# Patient Record
Sex: Female | Born: 1975 | Race: Asian | Hispanic: No | Marital: Single | Smoking: Never smoker
Health system: Southern US, Community
[De-identification: ages and names within clinical notes are randomized; demographics above are authoritative.]

## PROBLEM LIST (undated history)

## (undated) DIAGNOSIS — K589 Irritable bowel syndrome without diarrhea: Secondary | ICD-10-CM

## (undated) DIAGNOSIS — F419 Anxiety disorder, unspecified: Secondary | ICD-10-CM

---

## 2001-01-01 ENCOUNTER — Other Ambulatory Visit: Admission: RE | Admit: 2001-01-01 | Discharge: 2001-01-01 | Payer: Self-pay | Admitting: Obstetrics and Gynecology

## 2002-01-01 ENCOUNTER — Ambulatory Visit (HOSPITAL_COMMUNITY): Admission: RE | Admit: 2002-01-01 | Discharge: 2002-01-01 | Payer: Self-pay | Admitting: Obstetrics and Gynecology

## 2002-01-01 ENCOUNTER — Encounter: Payer: Self-pay | Admitting: Obstetrics and Gynecology

## 2002-05-27 ENCOUNTER — Encounter (INDEPENDENT_AMBULATORY_CARE_PROVIDER_SITE_OTHER): Payer: Self-pay

## 2002-05-27 ENCOUNTER — Inpatient Hospital Stay (HOSPITAL_COMMUNITY): Admission: AD | Admit: 2002-05-27 | Discharge: 2002-05-30 | Payer: Self-pay | Admitting: Obstetrics and Gynecology

## 2002-05-31 ENCOUNTER — Encounter: Admission: RE | Admit: 2002-05-31 | Discharge: 2002-06-30 | Payer: Self-pay | Admitting: Obstetrics and Gynecology

## 2003-01-04 ENCOUNTER — Other Ambulatory Visit: Admission: RE | Admit: 2003-01-04 | Discharge: 2003-01-04 | Payer: Self-pay | Admitting: *Deleted

## 2009-07-21 ENCOUNTER — Ambulatory Visit: Payer: Self-pay | Admitting: Internal Medicine

## 2009-07-21 DIAGNOSIS — N390 Urinary tract infection, site not specified: Secondary | ICD-10-CM | POA: Insufficient documentation

## 2009-07-21 LAB — CONVERTED CEMR LAB
Bilirubin Urine: NEGATIVE
Blood in Urine, dipstick: NEGATIVE
Glucose, Urine, Semiquant: NEGATIVE
Ketones, urine, test strip: NEGATIVE
Nitrite: NEGATIVE
Pap Smear: NORMAL
Protein, U semiquant: NEGATIVE
Specific Gravity, Urine: <1.005
Urobilinogen, UA: 0.2
WBC Urine, dipstick: NEGATIVE
pH: 5

## 2009-07-22 ENCOUNTER — Encounter: Payer: Self-pay | Admitting: Internal Medicine

## 2011-04-20 NOTE — Discharge Summary (Signed)
[facility]  Patient:    Chelsea Valentine, Chelsea Valentine Visit Number: [id number] MRN: [id number]          Service Type: OBS Location: [id number] Attending Physician:  Oliver Pila Dictated by:   Zenaida Niece, M.D. Admit Date:  05/27/2002 Discharge Date: 05/30/2002                             Discharge Summary  ADMISSION DIAGNOSES:          Intrauterine pregnancy at 39 weeks.  DISCHARGE DIAGNOSIS:          Intrauterine pregnancy at 39 weeks.  PROCEDURE:                    Spontaneous vaginal delivery and removal of skin tag from right buttock.  HISTORY OF PRESENT ILLNESS:   This is a 35 year old Asian female, gravida 1, para 0, with an EGA of 39+ weeks with a due date of July 1 by an LMP consistent with a first trimester ultrasound, who presents with the complaint of rupture of membranes at 2200 on June 25 without contractions or bleeding and with good fetal movement.  Rupture of membranes was confirmed in maternity admissions unit and she was admitted at approximately 0130 on June 26, and begun on Pitocin.  Prenatal care has been uncomplicated.  PRENATAL LABORATORY DATA:     Blood type is B positive with a negative antibody screen, RPR nonreactive, rubella immune, hepatitis B surface antigen negative, HIV negative, Gonorrhea and Chlamydia negative, triple screen normal, Group B Strep negative.  One-hour GCT is normal.  PAST MEDICAL HISTORY:         Noncontributory.  PHYSICAL EXAMINATION:         VITAL SIGNS: She is afebrile with stable vital signs.  Fetal heart rate tracing reactive.  ABDOMEN: Gravid and nontender with an estimated fetal weight of 7-1/2 to 8 pounds.  PELVIC: Vaginal examination per the nurses at 0700 on June 26 is 2+, 70, -1.  HOSPITAL COURSE:              The patient was admitted with rupture of membranes and started on Pitocin.  She received IV Stadol and then received an epidural for pain.  She had an IUPC placed  to better monitor her contractions and adjust Pitocin.  On the afternoon of June 26, she reached complete, pushed well, and had a vaginal delivery of a vigorous female infant over a first degree laceration.  Apgars were 9 and 9, weight was 7 pounds 12 ounces.  There was a nuchal cord x1 which was reduced.  The placenta delivered spontaneously with a three vessel cord.  First degree laceration repaired with 2-0 Vicryl. She had a small skin tag removed from her right buttock per patient request. Estimated blood loss was 450 cc.  Postpartum, she did very well and had a low grade temperature to 100.5 immediately after delivery, but other than that remained afebrile and breastfed her baby without complications.  Predelivery hemoglobin 11.2, postdelivery 8.9.  On the morning of postpartum day #2, she was stable for discharge home.  DISCHARGE INSTRUCTIONS:       Diet is a regular diet.  Activity is pelvic rest. Follow up is in six weeks.  DISCHARGE MEDICATIONS:        Percocet one to two p.o. q.4-6h. p.r.n. #20, and over-the-counter Motrin p.r.n.  She is given our discharge  pamphlet. Dictated by:   Zenaida Niece, M.D. Attending Physician:  Oliver Pila DD:  05/30/02 TD:  06/01/02 Job: [id number]

## 2017-06-20 DIAGNOSIS — K5909 Other constipation: Secondary | ICD-10-CM | POA: Insufficient documentation

## 2017-06-20 DIAGNOSIS — R1033 Periumbilical pain: Secondary | ICD-10-CM | POA: Insufficient documentation

## 2017-06-20 DIAGNOSIS — R140 Abdominal distension (gaseous): Secondary | ICD-10-CM | POA: Insufficient documentation

## 2017-07-04 ENCOUNTER — Other Ambulatory Visit: Payer: Self-pay | Admitting: Gastroenterology

## 2017-07-04 DIAGNOSIS — R1011 Right upper quadrant pain: Secondary | ICD-10-CM

## 2017-07-04 NOTE — Progress Notes (Signed)
Chelsea Munter MD 

## 2017-07-11 ENCOUNTER — Encounter (HOSPITAL_COMMUNITY)
Admission: RE | Admit: 2017-07-11 | Discharge: 2017-07-11 | Disposition: A | Discharge status: Home / Self Care | Payer: BLUE CROSS/BLUE SHIELD | Source: Ambulatory Visit | Attending: Gastroenterology | Admitting: Gastroenterology

## 2017-07-11 ENCOUNTER — Encounter (HOSPITAL_COMMUNITY): Payer: Self-pay

## 2017-07-11 DIAGNOSIS — R1011 Right upper quadrant pain: Secondary | ICD-10-CM | POA: Insufficient documentation

## 2017-07-11 IMAGING — US US ABDOMEN COMPLETE
1 series · 13 of 25 positions shown · non-contrast
Comparison: None.

CLINICAL DATA: Right upper quadrant abdominal pain.

EXAM:
ABDOMEN ULTRASOUND COMPLETE

[Series 1: us abdomen complete · 0.19mm/px · 13 of 120 slices shown]
[im 1/120]
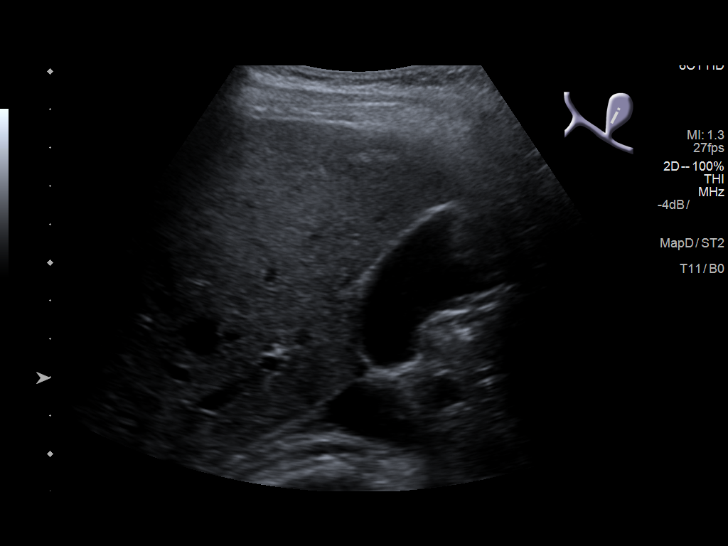
[im 10/120]
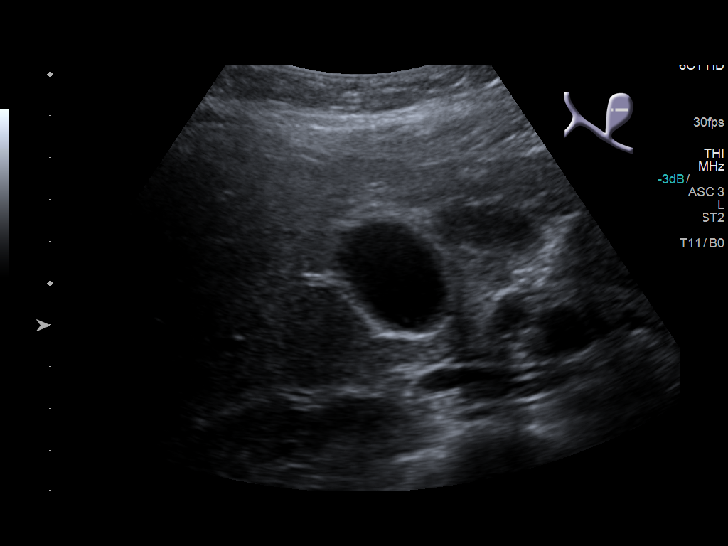
[im 20/120]
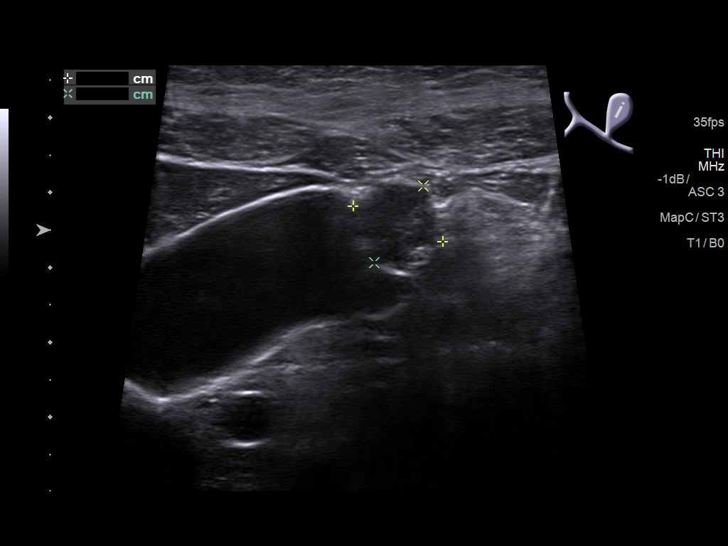
[im 30/120]
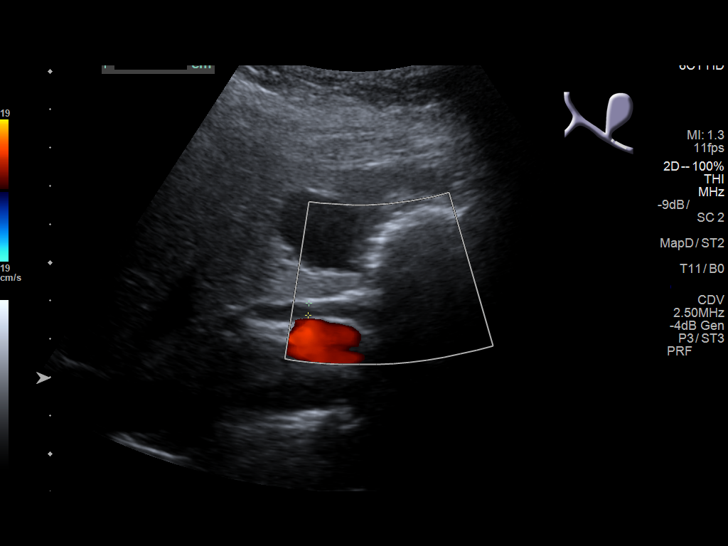
[im 40/120]
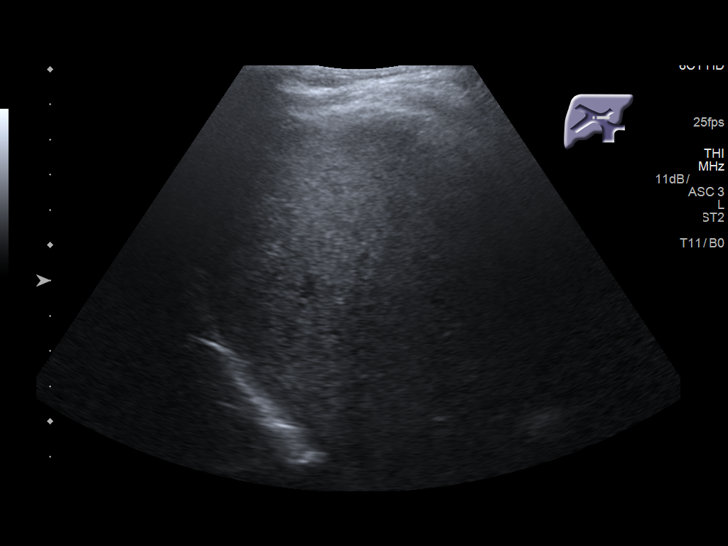
[im 50/120]
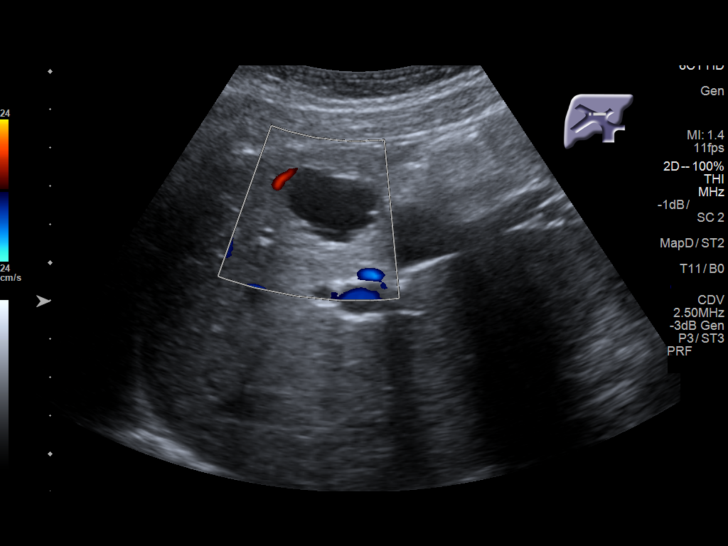
[im 60/120]
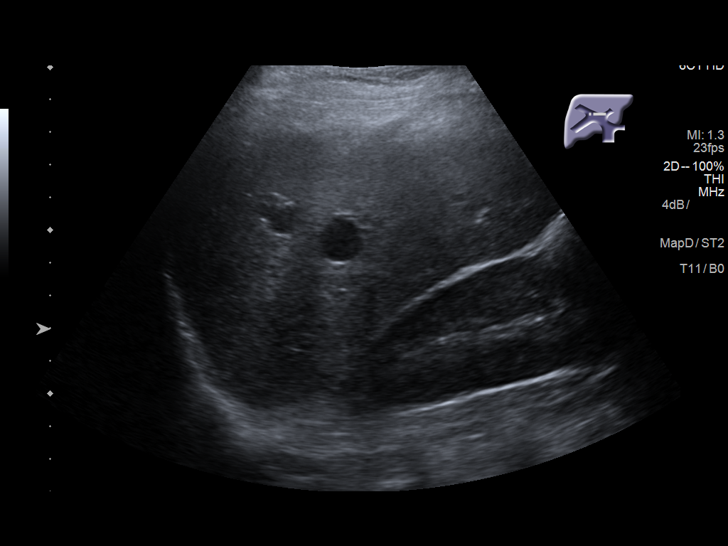
[im 70/120]
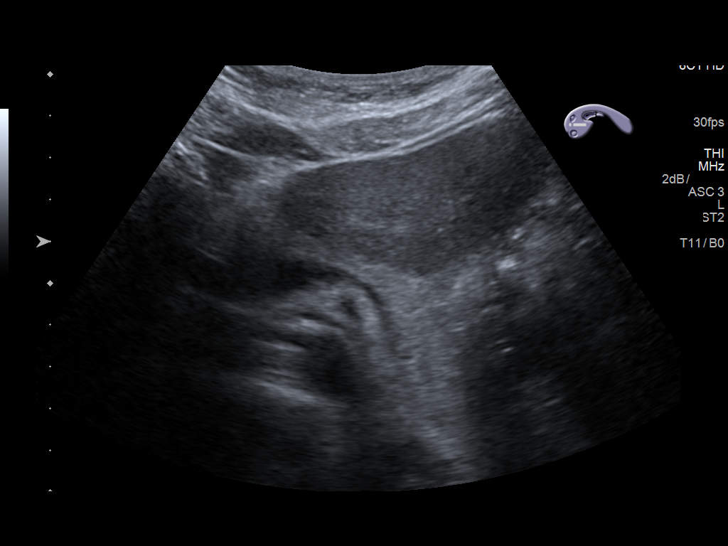
[im 80/120]
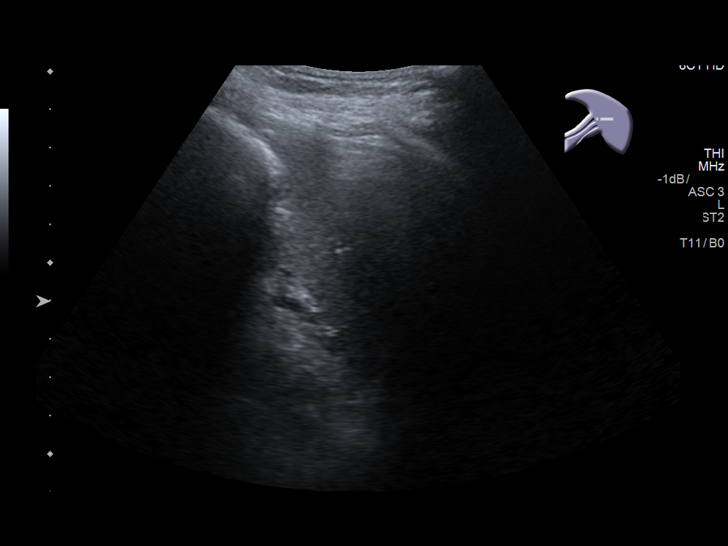
[im 90/120]
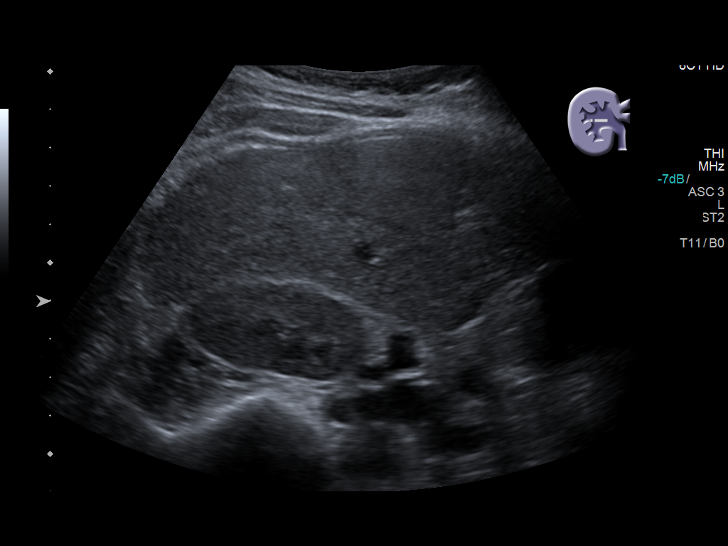
[im 100/120]
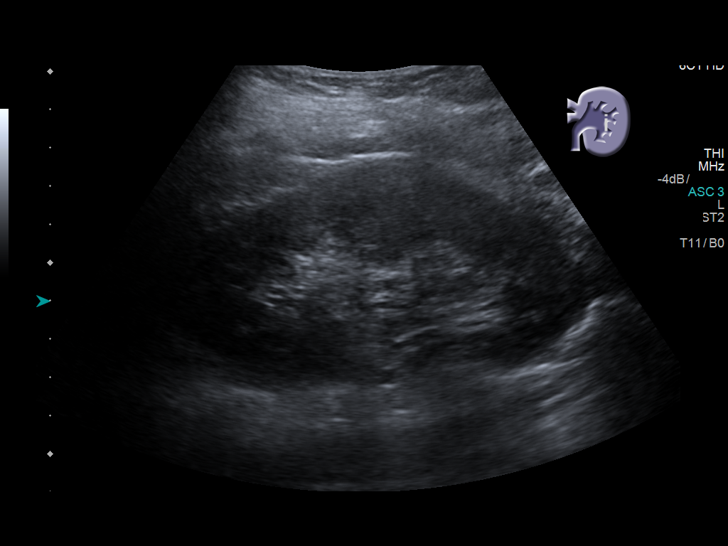
[im 110/120]
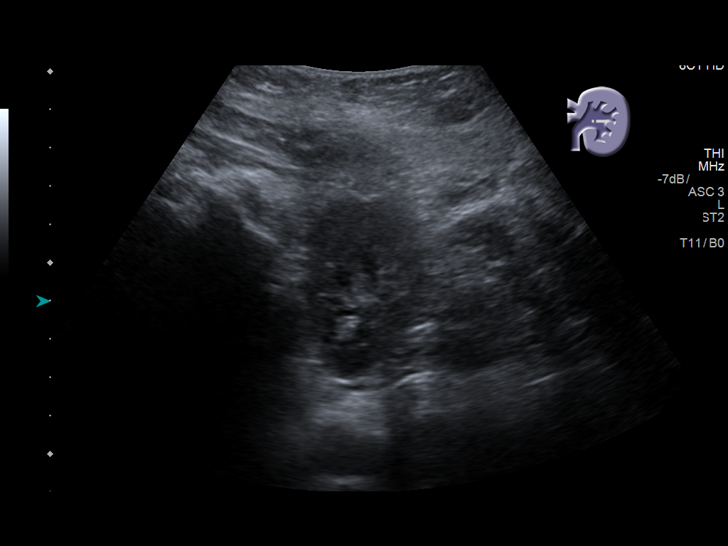
[im 120/120]
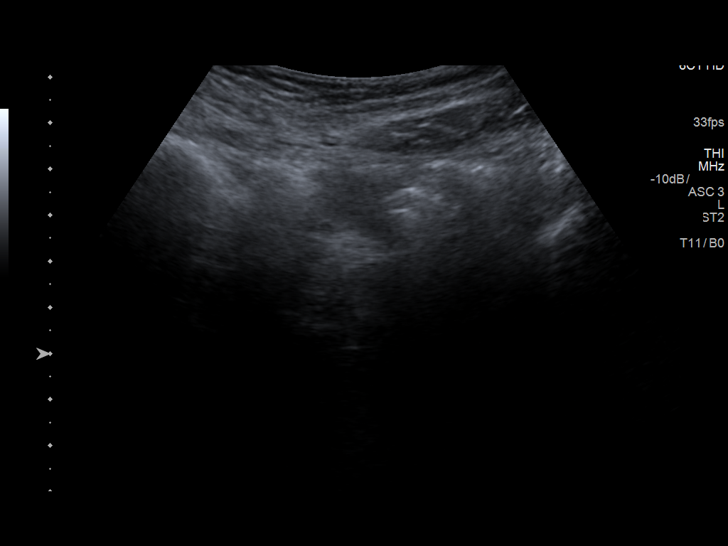

[13 of 25 positions shown; findings below may reference images not displayed]

FINDINGS: Gallbladder: Complex hypoechoic mass is seen involving the fundus
that measures 1.5 x 1.3 x 1.2 cm. Several focal calcifications are
noted within it. No gallbladder wall thickening or pericholecystic
fluid is noted. No sonographic Murphy's sign is noted.

Common bile duct: Diameter: 3.2 mm which is within normal limits.

Liver: Multiple cysts are noted with the largest measuring 2.5 cm in
the left hepatic lobe. Within normal limits in parenchymal
echogenicity.

IVC: No abnormality visualized.

Pancreas: Visualized portion unremarkable.

Spleen: Size and appearance within normal limits.

Right Kidney: Length: 10.5 cm. Echogenicity within normal limits. No
mass or hydronephrosis visualized.

Left Kidney: Length: 10.5 cm. Echogenicity within normal limits. No
mass or hydronephrosis visualized.

Abdominal aorta: No aneurysm visualized.

Other findings: None.
IMPRESSION: Hepatic cysts.

1.5 cm complex hypoechoic mass is seen involving the fundus of the
gallbladder with several associated calcifications. This is
concerning for possible gallbladder carcinoma. Further evaluation
with CT or MRI scan is recommended. These results will be called to
the ordering clinician or representative by the Radiologist
Assistant, and communication documented in the PACS or zVision
Dashboard.

## 2017-07-12 ENCOUNTER — Other Ambulatory Visit: Payer: Self-pay | Admitting: Gastroenterology

## 2017-07-12 ENCOUNTER — Ambulatory Visit
Admission: RE | Admit: 2017-07-12 | Discharge: 2017-07-12 | Disposition: A | Discharge status: Home / Self Care | Payer: BLUE CROSS/BLUE SHIELD | Source: Ambulatory Visit | Attending: Gastroenterology | Admitting: Gastroenterology

## 2017-07-12 ENCOUNTER — Other Ambulatory Visit (HOSPITAL_COMMUNITY): Payer: Self-pay | Admitting: Endocrinology

## 2017-07-12 DIAGNOSIS — R1012 Left upper quadrant pain: Secondary | ICD-10-CM

## 2017-07-12 DIAGNOSIS — C73 Malignant neoplasm of thyroid gland: Secondary | ICD-10-CM

## 2017-07-12 MED ORDER — IOPAMIDOL (ISOVUE-300) INJECTION 61%
100.0000 mL | Freq: Once | INTRAVENOUS | Status: AC | PRN
  Administered 2017-07-12: 100 mL via INTRAVENOUS

## 2017-07-15 ENCOUNTER — Encounter (HOSPITAL_COMMUNITY): Payer: Self-pay

## 2017-07-15 ENCOUNTER — Ambulatory Visit (HOSPITAL_COMMUNITY): Payer: BLUE CROSS/BLUE SHIELD

## 2017-07-26 ENCOUNTER — Encounter (HOSPITAL_COMMUNITY)
Admission: RE | Admit: 2017-07-26 | Discharge: 2017-07-26 | Disposition: A | Discharge status: Home / Self Care | Payer: BLUE CROSS/BLUE SHIELD | Source: Ambulatory Visit | Attending: Gastroenterology | Admitting: Gastroenterology

## 2017-07-26 DIAGNOSIS — R1011 Right upper quadrant pain: Secondary | ICD-10-CM | POA: Diagnosis present

## 2017-07-26 MED ORDER — TECHNETIUM TC 99M MEBROFENIN IV KIT
5.0000 | PACK | Freq: Once | INTRAVENOUS | Status: AC | PRN
  Administered 2017-07-26: 5 via INTRAVENOUS

## 2017-07-29 ENCOUNTER — Ambulatory Visit (HOSPITAL_COMMUNITY): Payer: BLUE CROSS/BLUE SHIELD

## 2017-08-19 ENCOUNTER — Other Ambulatory Visit: Payer: Self-pay | Admitting: Surgery

## 2017-08-29 NOTE — Patient Instructions (Addendum)
Chelsea Valentine  08/29/2017   Your procedure is scheduled on: 09-03-17  Report to [facility] Main  Entrance Take [place]  Elevators to 3rd floor to  [place] at 5:25 AM.   Call this number if you have problems the morning of surgery [phone]    Remember: ONLY 1 PERSON MAY GO WITH YOU TO SHORT STAY TO GET  READY MORNING OF [place].  Do not eat food or drink liquids :After Midnight.     Take these medicines the morning of surgery with A SIP OF WATER: None                                You may not have any metal on your body including hair pins and              piercings  Do not wear jewelry, make-up, lotions, powders or perfumes, deodorant             Do not wear nail polish.  Do not shave  48 hours prior to surgery.                Do not bring valuables to the hospital. Hall.  Contacts, dentures or bridgework may not be worn into surgery.     Patients discharged the day of surgery will not be allowed to drive home.  Name and phone number of your driver: Reggie [phone]                Please read over the following fact sheets you were given: _____________________________________________________________________             [facility] - Preparing for Surgery Before surgery, you can play an important role.  Because skin is not sterile, your skin needs to be as free of germs as possible.  You can reduce the number of germs on your skin by washing with CHG (chlorahexidine gluconate) soap before surgery.  CHG is an antiseptic cleaner which kills germs and bonds with the skin to continue killing germs even after washing. Please DO NOT use if you have an allergy to CHG or antibacterial soaps.  If your skin becomes reddened/irritated stop using the CHG and inform your nurse when you arrive at Short Stay. Do not shave (including legs and underarms) for at least 48 hours prior to the first  CHG shower.  You may shave your face/neck. Please follow these instructions carefully:  1.  Shower with CHG Soap the night before surgery and the  morning of Surgery.  2.  If you choose to wash your hair, wash your hair first as usual with your  normal  shampoo.  3.  After you shampoo, rinse your hair and body thoroughly to remove the  shampoo.                           4.  Use CHG as you would any other liquid soap.  You can apply chg directly  to the skin and wash                       Gently with a scrungie or clean washcloth.  5.  Apply the CHG Soap to your body ONLY FROM THE NECK DOWN.   Do not use on face/ open                           Wound or open sores. Avoid contact with eyes, ears mouth and genitals (private parts).                       Wash face,  Genitals (private parts) with your normal soap.             6.  Wash thoroughly, paying special attention to the area where your surgery  will be performed.  7.  Thoroughly rinse your body with warm water from the neck down.  8.  DO NOT shower/wash with your normal soap after using and rinsing off  the CHG Soap.                9.  Pat yourself dry with a clean towel.            10.  Wear clean pajamas.            11.  Place clean sheets on your bed the night of your first shower and do not  sleep with pets. Day of Surgery : Do not apply any lotions/deodorants the morning of surgery.  Please wear clean clothes to the hospital/surgery center.  FAILURE TO FOLLOW THESE INSTRUCTIONS MAY RESULT IN THE CANCELLATION OF YOUR SURGERY PATIENT SIGNATURE_________________________________  NURSE SIGNATURE__________________________________  ________________________________________________________________________

## 2017-08-30 ENCOUNTER — Encounter (HOSPITAL_COMMUNITY)
Admission: RE | Admit: 2017-08-30 | Discharge: 2017-08-30 | Disposition: A | Discharge status: Home / Self Care | Payer: BLUE CROSS/BLUE SHIELD | Source: Ambulatory Visit | Attending: Surgery | Admitting: Surgery

## 2017-08-30 ENCOUNTER — Encounter (HOSPITAL_COMMUNITY): Payer: Self-pay

## 2017-08-30 DIAGNOSIS — K828 Other specified diseases of gallbladder: Secondary | ICD-10-CM | POA: Insufficient documentation

## 2017-08-30 DIAGNOSIS — Z01818 Encounter for other preprocedural examination: Secondary | ICD-10-CM | POA: Insufficient documentation

## 2017-08-30 HISTORY — DX: Anxiety disorder, unspecified: F41.9

## 2017-08-30 HISTORY — DX: Irritable bowel syndrome without diarrhea: K58.9

## 2017-08-30 LAB — PREGNANCY, URINE: Preg Test, Ur: NEGATIVE

## 2017-08-30 LAB — CBC
HEMATOCRIT: 37.1 % (ref 36.0–46.0)
HEMOGLOBIN: 12.4 g/dL (ref 12.0–15.0)
MCH: 31.2 pg (ref 26.0–34.0)
MCHC: 33.4 g/dL (ref 30.0–36.0)
MCV: 93.2 fL (ref 78.0–100.0)
Platelets: 247 10*3/uL (ref 150–400)
RBC: 3.98 MIL/uL (ref 3.87–5.11)
RDW: 13.8 % (ref 11.5–15.5)
WBC: 6 10*3/uL (ref 4.0–10.5)

## 2017-09-02 NOTE — Anesthesia Preprocedure Evaluation (Addendum)
Anesthesia Evaluation  Patient identified by MRN, date of birth, ID band Patient awake    Reviewed: Allergy & Precautions, NPO status , Patient's Chart, lab work & pertinent test results  Airway Mallampati: II  TM Distance: >3 FB Neck ROM: Full    Dental  (+) Dental Advisory Given   Pulmonary neg pulmonary ROS,    breath sounds clear to auscultation       Cardiovascular negative cardio ROS   Rhythm:Regular Rate:Normal     Neuro/Psych negative neurological ROS     GI/Hepatic negative GI ROS, Neg liver ROS,   Endo/Other  negative endocrine ROS  Renal/GU negative Renal ROS     Musculoskeletal   Abdominal   Peds  Hematology negative hematology ROS (+)   Anesthesia Other Findings   Reproductive/Obstetrics                            Lab Results  Component Value Date   WBC 6.0 08/30/2017   HGB 12.4 08/30/2017   HCT 37.1 08/30/2017   MCV 93.2 08/30/2017   PLT 247 08/30/2017    Anesthesia Physical Anesthesia Plan  ASA: I  Anesthesia Plan: General   Post-op Pain Management:    Induction: Intravenous  PONV Risk Score and Plan: 4 or greater and Ondansetron, Dexamethasone, Midazolam, Scopolamine patch - Pre-op and Treatment may vary due to age or medical condition  Airway Management Planned: Oral ETT  Additional Equipment:   Intra-op Plan:   Post-operative Plan: Extubation in OR  Informed Consent: I have reviewed the patients History and Physical, chart, labs and discussed the procedure including the risks, benefits and alternatives for the proposed anesthesia with the patient or authorized representative who has indicated his/her understanding and acceptance.   Dental advisory given  Plan Discussed with: CRNA  Anesthesia Plan Comments:        Anesthesia Quick Evaluation

## 2017-09-02 NOTE — H&P (Signed)
Chelsea Valentine 08/19/2017 9:22 AM Location: [facility] Patient #: [id number] DOB: October 14, 1976 Divorced / Language: Cleophus Molt / Race: Undefined Female   History of Present Illness (Murray Guzzetta A. Ninfa Linden MD; 08/19/2017 9:48 AM) The patient is a 41 year old female who presents with abdominal pain. This patient is referred to me by Dr. Juanita Craver for evaluation of abdominal pain. She has been having severe mid to low epigastric and central abdominal pain for approximately 6 weeks. It is worse with fatty meals. She has had bloating but no nausea or vomiting. She denies jaundice. She has chronic constipation. She underwent an ultrasound which showed a 1.5 cm complex hypoechoic mass with calcifications in the gallbladder. This was worrisome for gallbladder cancer. She went on to have a CT scan with the findings on the gallbladder the favored a simple Phrygian cap and possible adenomas. She also had a HIDA scan showing a 19% gallbladder ejection fraction. She reports that the pain is less now. She is otherwise healthy without complaints. Upper endoscopy was unremarkable   Past Surgical History Malachi Bonds, CMA; 08/19/2017 9:31 AM) No pertinent past surgical history   Diagnostic Studies History Malachi Bonds, CMA; 08/19/2017 9:31 AM) Colonoscopy  never Mammogram  within last year Pap Smear  1-5 years ago  Allergies Dalbert Mayotte, [place]; 08/19/2017 9:23 AM) No Known Drug Allergies 08/19/2017 Allergies Reconciled   Medication History Dalbert Mayotte, CMA; 08/19/2017 9:28 AM) Rennis Harding (5MG  Tablet, Oral) Active. Linzess (145MCG Capsule, Oral) Active. Medications Reconciled Ortho Tri-Cyclen (21) (Oral) Active.  Social History Malachi Bonds, CMA; 08/19/2017 9:31 AM) Alcohol use  Occasional alcohol use. Caffeine use  Coffee. No drug use  Tobacco use  Never smoker.  Family History Malachi Bonds, CMA; 08/19/2017 9:31 AM) Diabetes Mellitus   Mother. Hypertension  Mother.  Pregnancy / Birth History Malachi Bonds, CMA; 08/19/2017 9:31 AM) Age at menarche  74 years. Contraceptive History  Oral contraceptives. Gravida  3 Length (months) of breastfeeding  3-6 Maternal age  64-30 Para  3 Regular periods   Other Problems Malachi Bonds, CMA; 08/19/2017 9:31 AM) Anxiety Disorder  Depression     Review of Systems Malachi Bonds CMA; 08/19/2017 9:31 AM) General Not Present- Appetite Loss, Chills, Fatigue, Fever, Night Sweats, Weight Gain and Weight Loss. Skin Not Present- Change in Wart/Mole, Dryness, Hives, Jaundice, New Lesions, Non-Healing Wounds, Rash and Ulcer. HEENT Present- Seasonal Allergies. Not Present- Earache, Hearing Loss, Hoarseness, Nose Bleed, Oral Ulcers, Ringing in the Ears, Sinus Pain, Sore Throat, Visual Disturbances, Wears glasses/contact lenses and Yellow Eyes. Respiratory Not Present- Bloody sputum, Chronic Cough, Difficulty Breathing, Snoring and Wheezing. Breast Not Present- Breast Mass, Breast Pain, Nipple Discharge and Skin Changes. Cardiovascular Not Present- Chest Pain, Difficulty Breathing Lying Down, Leg Cramps, Palpitations, Rapid Heart Rate, Shortness of Breath and Swelling of Extremities. Gastrointestinal Present- Abdominal Pain, Bloating, Constipation, Excessive gas and Indigestion. Not Present- Bloody Stool, Change in Bowel Habits, Chronic diarrhea, Difficulty Swallowing, Gets full quickly at meals, Hemorrhoids, Nausea, Rectal Pain and Vomiting. Female Genitourinary Not Present- Frequency, Nocturia, Painful Urination, Pelvic Pain and Urgency. Musculoskeletal Not Present- Back Pain, Joint Pain, Joint Stiffness, Muscle Pain, Muscle Weakness and Swelling of Extremities. Neurological Not Present- Decreased Memory, Fainting, Headaches, Numbness, Seizures, Tingling, Tremor, Trouble walking and Weakness. Psychiatric Present- Anxiety and Depression. Not Present- Bipolar, Change in Sleep Pattern,  Fearful and Frequent crying. Endocrine Not Present- Cold Intolerance, Excessive Hunger, Hair Changes, Heat Intolerance, Hot flashes and New Diabetes. Hematology Not Present- Blood Thinners, Easy Bruising, Excessive bleeding,  Gland problems, HIV and Persistent Infections.  Vitals Dalbert Mayotte CMA; 08/19/2017 9:29 AM) 08/19/2017 9:28 AM Weight: 121 lb Height: 64in Body Surface Area: 1.58 m Body Mass Index: 20.77 kg/m  Temp.: 97.53F  Pulse: 100 (Regular)  BP: 98/64 (Sitting, Left Arm, Standard)       Physical Exam (Kimela Malstrom A. Ninfa Linden MD; 08/19/2017 9:49 AM) General Mental Status-Alert. General Appearance-Consistent with stated age. Hydration-Well hydrated. Voice-Normal.  Head and Neck Head-normocephalic, atraumatic with no lesions or palpable masses.  Eye Eyeball - Bilateral-Extraocular movements intact. Sclera/Conjunctiva - Bilateral-No scleral icterus.  Chest and Lung Exam Chest and lung exam reveals -quiet, even and easy respiratory effort with no use of accessory muscles and on auscultation, normal breath sounds, no adventitious sounds and normal vocal resonance. Inspection Chest Wall - Normal. Back - normal.  Cardiovascular Cardiovascular examination reveals -on palpation PMI is normal in location and amplitude, no palpable S3 or S4. Normal cardiac borders., normal heart sounds, regular rate and rhythm with no murmurs, carotid auscultation reveals no bruits and normal pedal pulses bilaterally.  Abdomen Inspection Inspection of the abdomen reveals - No Hernias. Skin - Scar - no surgical scars. Palpation/Percussion Palpation and Percussion of the abdomen reveal - Soft, No Rebound tenderness, No Rigidity (guarding) and No hepatosplenomegaly. Tenderness - Periumbilical. Note: She is rather small regarding her abdomen. There is central tenderness which is mild with minimal guarding. Auscultation Auscultation of the abdomen reveals - Bowel  sounds normal.  Neurologic Neurologic evaluation reveals -alert and oriented x 3 with no impairment of recent or remote memory. Mental Status-Normal.  Musculoskeletal Normal Exam - Left-Upper Extremity Strength Normal and Lower Extremity Strength Normal. Normal Exam - Right-Upper Extremity Strength Normal, Lower Extremity Weakness.    Assessment & Plan (Gerturde Kuba A. Ninfa Linden MD; 08/19/2017 9:50 AM) BILIARY DYSKINESIA (K82.8) Impression: She does have dyskinesia on her HIDA scan. I am concerned about the ultrasound findings and the very smallest chance that we could be dealing with malignancy of the gallbladder. We discussed continued expectant management with a follow-up MRI in a few months versus proceeding with a cholecystectomy. Based on her symptoms and the x-ray findings, I think we should proceed with surgery. We had a long discussion regarding this. I discussed the risks of surgery which includes but is not limited to bleeding, infection, injury to surrounding structures, the need to convert to an open procedure, the need for other procedures, cardiopulmonary issues, DVT, bile duct injury, possibility of finding malignancy, postoperative recovery, etc. After long discussion, she wishes to proceed with surgery

## 2017-09-03 ENCOUNTER — Ambulatory Visit (HOSPITAL_COMMUNITY)
Admission: RE | Admit: 2017-09-03 | Discharge: 2017-09-03 | Disposition: A | Discharge status: Home / Self Care | Payer: BLUE CROSS/BLUE SHIELD | Source: Ambulatory Visit | Attending: Surgery | Admitting: Surgery

## 2017-09-03 ENCOUNTER — Encounter (HOSPITAL_COMMUNITY): Payer: Self-pay | Admitting: Surgery

## 2017-09-03 ENCOUNTER — Ambulatory Visit (HOSPITAL_COMMUNITY): Payer: BLUE CROSS/BLUE SHIELD | Admitting: Anesthesiology

## 2017-09-03 ENCOUNTER — Encounter (HOSPITAL_COMMUNITY)
Admission: RE | Disposition: A | Discharge status: Home / Self Care | Payer: Self-pay | Source: Ambulatory Visit | Attending: Surgery

## 2017-09-03 DIAGNOSIS — K8010 Calculus of gallbladder with chronic cholecystitis without obstruction: Secondary | ICD-10-CM | POA: Diagnosis present

## 2017-09-03 HISTORY — PX: CHOLECYSTECTOMY: SHX55

## 2017-09-03 SURGERY — LAPAROSCOPIC CHOLECYSTECTOMY
Anesthesia: General | Site: Abdomen

## 2017-09-03 MED ORDER — SUGAMMADEX SODIUM 200 MG/2ML IV SOLN
INTRAVENOUS | Status: AC
  Filled 2017-09-03: qty 2

## 2017-09-03 MED ORDER — OXYCODONE HCL 5 MG PO TABS
5.0000 mg | ORAL_TABLET | ORAL | Status: DC | PRN
  Administered 2017-09-03: 5 mg via ORAL
  Filled 2017-09-03: qty 1

## 2017-09-03 MED ORDER — CEFAZOLIN SODIUM-DEXTROSE 2-4 GM/100ML-% IV SOLN
2.0000 g | INTRAVENOUS | Status: AC
  Administered 2017-09-03: 2 g via INTRAVENOUS
  Filled 2017-09-03: qty 100

## 2017-09-03 MED ORDER — EPHEDRINE 5 MG/ML INJ
INTRAVENOUS | Status: AC
  Filled 2017-09-03: qty 10

## 2017-09-03 MED ORDER — FENTANYL CITRATE (PF) 100 MCG/2ML IJ SOLN
INTRAMUSCULAR | Status: AC
Start: 2017-09-03 — End: 2017-09-03
  Filled 2017-09-03: qty 2

## 2017-09-03 MED ORDER — ONDANSETRON HCL 4 MG/2ML IJ SOLN
INTRAMUSCULAR | Status: DC | PRN
  Administered 2017-09-03: 4 mg via INTRAVENOUS

## 2017-09-03 MED ORDER — CEFAZOLIN SODIUM-DEXTROSE 2-4 GM/100ML-% IV SOLN
INTRAVENOUS | Status: AC
  Filled 2017-09-03: qty 100

## 2017-09-03 MED ORDER — SCOPOLAMINE 1 MG/3DAYS TD PT72
MEDICATED_PATCH | TRANSDERMAL | Status: DC | PRN
  Administered 2017-09-03: 1 via TRANSDERMAL

## 2017-09-03 MED ORDER — ROCURONIUM BROMIDE 50 MG/5ML IV SOSY
PREFILLED_SYRINGE | INTRAVENOUS | Status: AC
  Filled 2017-09-03: qty 5

## 2017-09-03 MED ORDER — OXYCODONE HCL 5 MG PO TABS: 5.0000 mg | ORAL_TABLET | Freq: Four times a day (QID) | ORAL | 0 refills | Status: AC | PRN

## 2017-09-03 MED ORDER — ACETAMINOPHEN 650 MG RE SUPP
650.0000 mg | RECTAL | Status: DC | PRN
  Filled 2017-09-03: qty 1

## 2017-09-03 MED ORDER — HYDROMORPHONE HCL-NACL 0.5-0.9 MG/ML-% IV SOSY
PREFILLED_SYRINGE | INTRAVENOUS | Status: AC
  Filled 2017-09-03: qty 2

## 2017-09-03 MED ORDER — SCOPOLAMINE 1 MG/3DAYS TD PT72
MEDICATED_PATCH | TRANSDERMAL | Status: AC
  Filled 2017-09-03: qty 1

## 2017-09-03 MED ORDER — KETOROLAC TROMETHAMINE 30 MG/ML IJ SOLN
INTRAMUSCULAR | Status: AC
  Filled 2017-09-03: qty 1

## 2017-09-03 MED ORDER — DEXAMETHASONE SODIUM PHOSPHATE 10 MG/ML IJ SOLN
INTRAMUSCULAR | Status: AC
  Filled 2017-09-03: qty 1

## 2017-09-03 MED ORDER — LIDOCAINE 2% (20 MG/ML) 5 ML SYRINGE
INTRAMUSCULAR | Status: AC
  Filled 2017-09-03: qty 5

## 2017-09-03 MED ORDER — SUCCINYLCHOLINE CHLORIDE 200 MG/10ML IV SOSY
PREFILLED_SYRINGE | INTRAVENOUS | Status: DC | PRN
  Administered 2017-09-03: 100 mg via INTRAVENOUS

## 2017-09-03 MED ORDER — ACETAMINOPHEN 325 MG PO TABS: 650.0000 mg | ORAL_TABLET | ORAL | Status: DC | PRN

## 2017-09-03 MED ORDER — SUGAMMADEX SODIUM 200 MG/2ML IV SOLN
INTRAVENOUS | Status: DC | PRN
  Administered 2017-09-03: 120 mg via INTRAVENOUS

## 2017-09-03 MED ORDER — GLYCOPYRROLATE 0.2 MG/ML IV SOSY
PREFILLED_SYRINGE | INTRAVENOUS | Status: AC
  Filled 2017-09-03: qty 5

## 2017-09-03 MED ORDER — MIDAZOLAM HCL 2 MG/2ML IJ SOLN
INTRAMUSCULAR | Status: AC
  Filled 2017-09-03: qty 2

## 2017-09-03 MED ORDER — ROCURONIUM BROMIDE 10 MG/ML (PF) SYRINGE
PREFILLED_SYRINGE | INTRAVENOUS | Status: DC | PRN
  Administered 2017-09-03: 20 mg via INTRAVENOUS
  Administered 2017-09-03: 10 mg via INTRAVENOUS

## 2017-09-03 MED ORDER — PROPOFOL 10 MG/ML IV BOLUS
INTRAVENOUS | Status: DC | PRN
  Administered 2017-09-03: 160 mg via INTRAVENOUS

## 2017-09-03 MED ORDER — LIDOCAINE 2% (20 MG/ML) 5 ML SYRINGE
INTRAMUSCULAR | Status: DC | PRN
  Administered 2017-09-03: 80 mg via INTRAVENOUS

## 2017-09-03 MED ORDER — BUPIVACAINE HCL (PF) 0.5 % IJ SOLN
INTRAMUSCULAR | Status: DC | PRN
  Administered 2017-09-03: 20 mL

## 2017-09-03 MED ORDER — HYDROMORPHONE HCL-NACL 0.5-0.9 MG/ML-% IV SOSY
0.2500 mg | PREFILLED_SYRINGE | INTRAVENOUS | Status: DC | PRN
  Administered 2017-09-03: 0.25 mg via INTRAVENOUS
  Administered 2017-09-03: 0.5 mg via INTRAVENOUS
  Administered 2017-09-03: 0.25 mg via INTRAVENOUS
  Administered 2017-09-03: 0.5 mg via INTRAVENOUS

## 2017-09-03 MED ORDER — SUCCINYLCHOLINE CHLORIDE 200 MG/10ML IV SOSY
PREFILLED_SYRINGE | INTRAVENOUS | Status: AC
  Filled 2017-09-03: qty 10

## 2017-09-03 MED ORDER — CHLORHEXIDINE GLUCONATE CLOTH 2 % EX PADS: 6.0000 | MEDICATED_PAD | Freq: Once | CUTANEOUS | Status: DC

## 2017-09-03 MED ORDER — LACTATED RINGERS IV SOLN
INTRAVENOUS | Status: DC | PRN
  Administered 2017-09-03: 07:00:00 via INTRAVENOUS

## 2017-09-03 MED ORDER — ONDANSETRON HCL 4 MG/2ML IJ SOLN
INTRAMUSCULAR | Status: AC
  Filled 2017-09-03: qty 2

## 2017-09-03 MED ORDER — PROPOFOL 10 MG/ML IV BOLUS
INTRAVENOUS | Status: AC
  Filled 2017-09-03: qty 20

## 2017-09-03 MED ORDER — DEXAMETHASONE SODIUM PHOSPHATE 10 MG/ML IJ SOLN
INTRAMUSCULAR | Status: DC | PRN
  Administered 2017-09-03: 10 mg via INTRAVENOUS

## 2017-09-03 MED ORDER — HYDROMORPHONE HCL-NACL 0.5-0.9 MG/ML-% IV SOSY
PREFILLED_SYRINGE | INTRAVENOUS | Status: AC
  Filled 2017-09-03: qty 1

## 2017-09-03 MED ORDER — MIDAZOLAM HCL 5 MG/5ML IJ SOLN
INTRAMUSCULAR | Status: DC | PRN
  Administered 2017-09-03: 2 mg via INTRAVENOUS

## 2017-09-03 MED ORDER — MORPHINE SULFATE (PF) 4 MG/ML IV SOLN: 1.0000 mg | INTRAVENOUS | Status: DC | PRN

## 2017-09-03 MED ORDER — EPHEDRINE SULFATE-NACL 50-0.9 MG/10ML-% IV SOSY
PREFILLED_SYRINGE | INTRAVENOUS | Status: DC | PRN
  Administered 2017-09-03: 5 mg via INTRAVENOUS

## 2017-09-03 MED ORDER — BUPIVACAINE HCL (PF) 0.5 % IJ SOLN
INTRAMUSCULAR | Status: AC
  Filled 2017-09-03: qty 30

## 2017-09-03 MED ORDER — PROMETHAZINE HCL 25 MG/ML IJ SOLN: 6.2500 mg | INTRAMUSCULAR | Status: DC | PRN

## 2017-09-03 MED ORDER — FENTANYL CITRATE (PF) 100 MCG/2ML IJ SOLN
INTRAMUSCULAR | Status: DC | PRN
  Administered 2017-09-03 (×3): 50 ug via INTRAVENOUS

## 2017-09-03 MED ORDER — GLYCOPYRROLATE 0.2 MG/ML IV SOSY
PREFILLED_SYRINGE | INTRAVENOUS | Status: DC | PRN
  Administered 2017-09-03: .1 mg via INTRAVENOUS

## 2017-09-03 MED ORDER — KETOROLAC TROMETHAMINE 30 MG/ML IJ SOLN
INTRAMUSCULAR | Status: DC | PRN
  Administered 2017-09-03: 30 mg via INTRAVENOUS

## 2017-09-03 MED ORDER — FENTANYL CITRATE (PF) 100 MCG/2ML IJ SOLN
INTRAMUSCULAR | Status: AC
  Filled 2017-09-03: qty 2

## 2017-09-03 SURGICAL SUPPLY — 26 items
APPLIER CLIP 5 13 M/L LIGAMAX5 (MISCELLANEOUS)
CABLE HIGH FREQUENCY MONO STRZ (ELECTRODE) ×2 IMPLANT
CHLORAPREP W/TINT 26ML (MISCELLANEOUS) ×2 IMPLANT
CLIP APPLIE 5 13 M/L LIGAMAX5 (MISCELLANEOUS) IMPLANT
COVER MAYO STAND STRL (DRAPES) IMPLANT
DECANTER SPIKE VIAL GLASS SM (MISCELLANEOUS) IMPLANT
DERMABOND ADVANCED (GAUZE/BANDAGES/DRESSINGS) ×1
DERMABOND ADVANCED .7 DNX12 (GAUZE/BANDAGES/DRESSINGS) ×1 IMPLANT
DRAPE C-ARM 42X120 X-RAY (DRAPES) IMPLANT
ELECT REM PT RETURN 15FT ADLT (MISCELLANEOUS) ×2 IMPLANT
GLOVE SURG SIGNA 7.5 PF LTX (GLOVE) ×2 IMPLANT
GOWN STRL REUS W/TWL XL LVL3 (GOWN DISPOSABLE) ×4 IMPLANT
HEMOSTAT SURGICEL 4X8 (HEMOSTASIS) IMPLANT
KIT BASIN OR (CUSTOM PROCEDURE TRAY) ×2 IMPLANT
POUCH SPECIMEN RETRIEVAL 10MM (ENDOMECHANICALS) ×2 IMPLANT
SCISSORS LAP 5X35 DISP (ENDOMECHANICALS) ×2 IMPLANT
SET CHOLANGIOGRAPH MIX (MISCELLANEOUS) IMPLANT
SET IRRIG TUBING LAPAROSCOPIC (IRRIGATION / IRRIGATOR) ×2 IMPLANT
SLEEVE XCEL OPT CAN 5 100 (ENDOMECHANICALS) ×4 IMPLANT
SUT MNCRL AB 4-0 PS2 18 (SUTURE) ×2 IMPLANT
TOWEL OR 17X26 10 PK STRL BLUE (TOWEL DISPOSABLE) ×2 IMPLANT
TOWEL OR NON WOVEN STRL DISP B (DISPOSABLE) ×2 IMPLANT
TRAY LAPAROSCOPIC (CUSTOM PROCEDURE TRAY) ×2 IMPLANT
TROCAR BLADELESS OPT 5 100 (ENDOMECHANICALS) ×2 IMPLANT
TROCAR XCEL BLUNT TIP 100MML (ENDOMECHANICALS) ×2 IMPLANT
TUBING INSUF HEATED (TUBING) ×2 IMPLANT

## 2017-09-03 NOTE — Transfer of Care (Signed)
Immediate Anesthesia Transfer of Care Note  Patient: Chelsea Valentine  Procedure(s) Performed: LAPAROSCOPIC CHOLECYSTECTOMY (N/A Abdomen)  Patient Location: PACU  Anesthesia Type:General  Level of Consciousness: awake, alert , oriented and patient cooperative  Airway & Oxygen Therapy: Patient Spontanous Breathing and Patient connected to face mask oxygen  Post-op Assessment: Report given to RN, Post -op Vital signs reviewed and stable and Patient moving all extremities  Post vital signs: Reviewed and stable  Last Vitals:  Vitals:   09/03/17 0519 09/03/17 0815  BP: 109/71 128/88  Pulse: (!) 59   Resp: 16   Temp: 37.1 C   SpO2: 100%     Last Pain:  Vitals:   09/03/17 0519  TempSrc: Oral      Patients Stated Pain Goal: 3 (08/25/29 0762)  Complications: No apparent anesthesia complications

## 2017-09-03 NOTE — Anesthesia Postprocedure Evaluation (Signed)
Anesthesia Post Note  Patient: Chelsea Valentine  Procedure(s) Performed: LAPAROSCOPIC CHOLECYSTECTOMY (N/A Abdomen)     Patient location during evaluation: PACU Anesthesia Type: General Level of consciousness: awake and alert Pain management: pain level controlled Vital Signs Assessment: post-procedure vital signs reviewed and stable Respiratory status: spontaneous breathing, nonlabored ventilation, respiratory function stable and patient connected to nasal cannula oxygen Cardiovascular status: blood pressure returned to baseline and stable Postop Assessment: no apparent nausea or vomiting Anesthetic complications: no    Last Vitals:  Vitals:   09/03/17 0855 09/03/17 0900  BP:  136/90  Pulse: 68 68  Resp: 10 11  Temp:    SpO2: 99% 99%    Last Pain:  Vitals:   09/03/17 0855  TempSrc:   PainSc: 4                  Tiajuana Amass

## 2017-09-03 NOTE — Anesthesia Procedure Notes (Signed)
Procedure Name: Intubation Date/Time: 09/03/2017 7:25 AM Performed by: Carleene Cooper A Pre-anesthesia Checklist: Patient identified, Emergency Drugs available, Suction available, Patient being monitored and Timeout performed Patient Re-evaluated:Patient Re-evaluated prior to induction Oxygen Delivery Method: Circle system utilized Preoxygenation: Pre-oxygenation with 100% oxygen Induction Type: IV induction Ventilation: Mask ventilation without difficulty Laryngoscope Size: Mac and 4 Grade View: Grade I Tube type: Oral Tube size: 7.5 mm Number of attempts: 1 Airway Equipment and Method: Stylet Placement Confirmation: ETT inserted through vocal cords under direct vision,  positive ETCO2 and breath sounds checked- equal and bilateral Secured at: 21 cm Tube secured with: Tape Dental Injury: Teeth and Oropharynx as per pre-operative assessment

## 2017-09-03 NOTE — Interval H&P Note (Signed)
History and Physical Interval Note:no change in H and P  09/03/2017 7:04 AM  Chelsea Valentine  has presented today for surgery, with the diagnosis of bilary dyskinesia  The various methods of treatment have been discussed with the patient and family. After consideration of risks, benefits and other options for treatment, the patient has consented to  Procedure(s): LAPAROSCOPIC CHOLECYSTECTOMY (N/A) as a surgical intervention .  The patient's history has been reviewed, patient examined, no change in status, stable for surgery.  I have reviewed the patient's chart and labs.  Questions were answered to the patient's satisfaction.     [name]

## 2017-09-03 NOTE — Op Note (Signed)
Laparoscopic Cholecystectomy Procedure Note  Indications: This patient presents with symptomatic gallbladder disease and will undergo laparoscopic cholecystectomy.  Pre-operative Diagnosis: biliary dyksinesia  Post-operative Diagnosis: Same  Surgeon: Coralie Keens A   Assistants: 0  Anesthesia: General endotracheal anesthesia  ASA Class: 1  Procedure Details  The patient was seen again in the Holding Room. The risks, benefits, complications, treatment options, and expected outcomes were discussed with the patient. The possibilities of reaction to medication, pulmonary aspiration, perforation of viscus, bleeding, recurrent infection, finding a normal gallbladder, the need for additional procedures, failure to diagnose a condition, the possible need to convert to an open procedure, and creating a complication requiring transfusion or operation were discussed with the patient. The likelihood of improving the patient's symptoms with return to their baseline status is good.  The patient and/or family concurred with the proposed plan, giving informed consent. The site of surgery properly noted. The patient was taken to Operating Room, identified as Chelsea Valentine and the procedure verified as Laparoscopic Cholecystectomy with Intraoperative Cholangiogram. A Time Out was held and the above information confirmed.  Prior to the induction of general anesthesia, antibiotic prophylaxis was administered. General endotracheal anesthesia was then administered and tolerated well. After the induction, the abdomen was prepped with Chloraprep and draped in sterile fashion. The patient was positioned in the supine position.  Local anesthetic agent was injected into the skin near the umbilicus and an incision made. We dissected down to the abdominal fascia with blunt dissection.  The fascia was incised vertically and we entered the peritoneal cavity bluntly.  A pursestring suture of 0-Vicryl was placed  around the fascial opening.  The Hasson cannula was inserted and secured with the stay suture.  Pneumoperitoneum was then created with CO2 and tolerated well without any adverse changes in the patient's vital signs. A 5-mm port was placed in the subxiphoid position.  Two 5-mm ports were placed in the right upper quadrant. All skin incisions were infiltrated with a local anesthetic agent before making the incision and placing the trocars.   We positioned the patient in reverse Trendelenburg, tilted slightly to the patient's left.  The gallbladder was identified, the fundus grasped and retracted cephalad. Adhesions were lysed bluntly and with the electrocautery where indicated, taking care not to injure any adjacent organs or viscus. The infundibulum was grasped and retracted laterally, exposing the peritoneum overlying the triangle of Calot. This was then divided and exposed in a blunt fashion. The cystic duct was clearly identified and bluntly dissected circumferentially. A critical view of the cystic duct and cystic artery was obtained.  The cystic duct was then ligated with clips and divided. The cystic artery was, dissected free, ligated with clips and divided as well.   The gallbladder was dissected from the liver bed in retrograde fashion with the electrocautery. The gallbladder was removed and placed in an Endocatch sac. The liver bed was irrigated and inspected. Hemostasis was achieved with the electrocautery. Copious irrigation was utilized and was repeatedly aspirated until clear.  The gallbladder and Endocatch sac were then removed through the umbilical port site.  The pursestring suture was used to close the umbilical fascia.    We again inspected the right upper quadrant for hemostasis.  Pneumoperitoneum was released as we removed the trocars.  4-0 Monocryl was used to close the skin.   Skin glue was then applied. The patient was then extubated and brought to the recovery room in stable condition.  Instrument, sponge, and needle counts were  correct at closure and at the conclusion of the case.   Findings: Mild chronic Cholecystitis without Cholelithiasis Possible gallbladder polyp Liver appeared normal  Estimated Blood Loss: Minimal         Drains: 0         Specimens: Gallbladder           Complications: None; patient tolerated the procedure well.         Disposition: PACU - hemodynamically stable.         Condition: stable

## 2017-09-03 NOTE — Discharge Instructions (Signed)
General Anesthesia, Adult, Care After These instructions provide you with information about caring for yourself after your procedure. Your health care provider may also give you more specific instructions. Your treatment has been planned according to current medical practices, but problems sometimes occur. Call your health care provider if you have any problems or questions after your procedure. What can I expect after the procedure? After the procedure, it is common to have:  Vomiting.  A sore throat.  Mental slowness.  It is common to feel:  Nauseous.  Cold or shivery.  Sleepy.  Tired.  Sore or achy, even in parts of your body where you did not have surgery.  Follow these instructions at home: For at least 24 hours after the procedure:  Do not: ? Participate in activities where you could fall or become injured. ? Drive. ? Use heavy machinery. ? Drink alcohol. ? Take sleeping pills or medicines that cause drowsiness. ? Make important decisions or sign legal documents. ? Take care of children on your own.  Rest. Eating and drinking  If you vomit, drink water, juice, or soup when you can drink without vomiting.  Drink enough fluid to keep your urine clear or pale yellow.  Make sure you have little or no nausea before eating solid foods.  Follow the diet recommended by your health care provider. General instructions  Have a responsible adult stay with you until you are awake and alert.  Return to your normal activities as told by your health care provider. Ask your health care provider what activities are safe for you.  Take over-the-counter and prescription medicines only as told by your health care provider.  If you smoke, do not smoke without supervision.  Keep all follow-up visits as told by your health care provider. This is important. Contact a health care provider if:  You continue to have nausea or vomiting at home, and medicines are not helpful.  You  cannot drink fluids or start eating again.  You cannot urinate after 8-12 hours.  You develop a skin rash.  You have fever.  You have increasing redness at the site of your procedure. Get help right away if:  You have difficulty breathing.  You have chest pain.  You have unexpected bleeding.  You feel that you are having a life-threatening or urgent problem. This information is not intended to replace advice given to you by your health care provider. Make sure you discuss any questions you have with your health care provider. Document Released: 02/25/2001 Document Revised: 04/23/2016 Document Reviewed: 11/03/2015 Elsevier Interactive Patient Education  2018 [place] [employer] LAPAROSCOPIC SURGERY: POST OP INSTRUCTIONS Always review your discharge instruction sheet given to you by the facility where your surgery was performed. IF YOU HAVE DISABILITY OR FAMILY LEAVE FORMS, YOU MUST BRING THEM TO THE OFFICE FOR PROCESSING.   DO NOT GIVE THEM TO YOUR DOCTOR.  1. A prescription for pain medication may be given to you upon discharge.  Take your pain medication as prescribed, if needed.  If narcotic pain medicine is not needed, then you may take acetaminophen (Tylenol) or ibuprofen (Advil) as needed. 2. Take your usually prescribed medications unless otherwise directed. 3. If you need a refill on your pain medication, please contact your pharmacy.  They will contact our office to request authorization. Prescriptions will not be filled after 5pm or on week-ends. 4. You should follow a light diet the first few days after arrival home, such as soup  and crackers, etc.  Be sure to include lots of fluids daily. 5. Most patients will experience some swelling and bruising in the area of the incisions.  Ice packs will help.  Swelling and bruising can take several days to resolve.  6. It is common to experience some constipation if taking pain medication after  surgery.  Increasing fluid intake and taking a stool softener (such as Colace) will usually help or prevent this problem from occurring.  A mild laxative (Milk of Magnesia or Miralax) should be taken according to package instructions if there are no bowel movements after 48 hours. 7. Unless discharge instructions indicate otherwise, you may remove your bandages 24-48 hours after surgery, and you may shower at that time.  You may have steri-strips (small skin tapes) in place directly over the incision.  These strips should be left on the skin for 7-10 days.  If your surgeon used skin glue on the incision, you may shower in 24 hours.  The glue will flake off over the next 2-3 weeks.  Any sutures or staples will be removed at the office during your follow-up visit. 8. ACTIVITIES:  You may resume regular (light) daily activities beginning the next day--such as daily self-care, walking, climbing stairs--gradually increasing activities as tolerated.  You may have sexual intercourse when it is comfortable.  Refrain from any heavy lifting or straining until approved by your doctor. a. You may drive when you are no longer taking prescription pain medication, you can comfortably wear a seatbelt, and you can safely maneuver your car and apply brakes. b. RETURN TO WORK:  __________________________________________________________ 9. You should see your doctor in the office for a follow-up appointment approximately 2-3 weeks after your surgery.  Make sure that you call for this appointment within a day or two after you arrive home to insure a convenient appointment time. 10. OTHER INSTRUCTIONS:OK TO SHOWER STARTING TOMORROW 11. ICE PACK, TYLENOL, AND IBUPROFEN ALSO FOR PAIN 12. NO LIFTING MORE THAN 15 POUNDS FOR 2 WEEKS __________________________________________________________________________________________________________________________  __________________________________________________________________________________________________________________________ WHEN TO CALL YOUR DOCTOR: 1. Fever over 101.0 2. Inability to urinate 3. Continued bleeding from incision. 4. Increased pain, redness, or drainage from the incision. 5. Increasing abdominal pain  The clinic staff is available to answer your questions during regular business hours.  Please dont hesitate to call and ask to speak to one of the nurses for clinical concerns.  If you have a medical emergency, go to the nearest emergency room or call 911.  A surgeon from [facility] is always on call at the hospital. [address], [place], [place], [place]  [address] ? [address], [place], [place]   [id number] [phone] ? [phone] ? FAX [phone] Web site: [email or web address]

## 2018-09-19 ENCOUNTER — Ambulatory Visit (INDEPENDENT_AMBULATORY_CARE_PROVIDER_SITE_OTHER): Payer: BLUE CROSS/BLUE SHIELD

## 2018-09-19 ENCOUNTER — Ambulatory Visit: Payer: BLUE CROSS/BLUE SHIELD | Admitting: Podiatry

## 2018-09-19 ENCOUNTER — Encounter: Payer: Self-pay | Admitting: Podiatry

## 2018-09-19 ENCOUNTER — Other Ambulatory Visit: Payer: Self-pay

## 2018-09-19 VITALS — BP 121/86 | HR 56

## 2018-09-19 DIAGNOSIS — M659 Synovitis and tenosynovitis, unspecified: Secondary | ICD-10-CM | POA: Diagnosis not present

## 2018-09-19 DIAGNOSIS — M898X7 Other specified disorders of bone, ankle and foot: Secondary | ICD-10-CM | POA: Diagnosis not present

## 2018-09-19 DIAGNOSIS — M25571 Pain in right ankle and joints of right foot: Secondary | ICD-10-CM

## 2018-09-19 DIAGNOSIS — M76821 Posterior tibial tendinitis, right leg: Secondary | ICD-10-CM

## 2018-09-19 DIAGNOSIS — M25471 Effusion, right ankle: Secondary | ICD-10-CM

## 2018-09-22 ENCOUNTER — Telehealth: Payer: Self-pay | Admitting: *Deleted

## 2018-09-22 ENCOUNTER — Other Ambulatory Visit: Payer: Self-pay | Admitting: Podiatry

## 2018-09-22 DIAGNOSIS — M25471 Effusion, right ankle: Secondary | ICD-10-CM

## 2018-09-22 DIAGNOSIS — M898X7 Other specified disorders of bone, ankle and foot: Secondary | ICD-10-CM

## 2018-09-22 DIAGNOSIS — M25571 Pain in right ankle and joints of right foot: Secondary | ICD-10-CM

## 2018-09-22 DIAGNOSIS — M659 Unspecified synovitis and tenosynovitis, unspecified site: Secondary | ICD-10-CM

## 2018-09-22 DIAGNOSIS — M76821 Posterior tibial tendinitis, right leg: Secondary | ICD-10-CM

## 2018-09-22 NOTE — Telephone Encounter (Signed)
Orders to Gretta Arab, RN for pre-cert, and faxed to [facility] Main Scheduling.

## 2018-09-22 NOTE — Telephone Encounter (Signed)
Pt request copy of the x-rays performed last week, for an appt with an orthopedist on 09/24/2018. Pt request a call when the disc is ready for pick up.

## 2018-09-22 NOTE — Telephone Encounter (Signed)
Left message informing pt the x-ray disc was available for pick up.

## 2018-12-02 NOTE — Progress Notes (Signed)
Subjective:  Patient ID: Chelsea Valentine, female    DOB: July 14, 1976,  MRN: [id number]  Chief Complaint  Patient presents with  . Foot Pain    right ankle pain x 1 year   42 y.o. female presents with the above complaint.  Reports pain to the inside of the right ankle for over a year worsened by exercise walking states that she has had acute episodes that has been re-exacerbated.   Review of Systems: Negative except as noted in the HPI. Denies N/V/F/Ch.  Past Medical History:  Diagnosis Date  . Anxiety   . IBS (irritable bowel syndrome)     Current Outpatient Medications:  .  clonazePAM (KLONOPIN) 0.5 MG tablet, Take 0.5 mg by mouth 2 (two) times daily as needed., Disp: , Rfl: 3 .  dicyclomine (BENTYL) 10 MG capsule, Take 10 mg by mouth 4 (four) times daily as needed. For IBS symptoms., Disp: , Rfl: 5 .  fluticasone (FLONASE) 50 MCG/ACT nasal spray, Place 1-2 sprays into both nostrils daily as needed. For allergies., Disp: , Rfl: 6 .  lactobacillus acidophilus (BACID) TABS tablet, Take 1 tablet by mouth daily., Disp: , Rfl:  .  linaclotide (LINZESS) 72 MCG capsule, Take 72 mcg by mouth daily before breakfast., Disp: , Rfl:  .  metroNIDAZOLE (METROGEL) 0.75 % vaginal gel, Place vaginally., Disp: , Rfl:  .  norgestimate-ethinyl estradiol (ORTHO-CYCLEN,SPRINTEC,PREVIFEM) 0.25-35 MG-MCG tablet, Take 1 tablet by mouth daily., Disp: , Rfl:  .  Norgestimate-Ethinyl Estradiol Triphasic 0.18/0.215/0.25 MG-35 MCG tablet, Take by mouth., Disp: , Rfl:  .  oxyCODONE (OXY IR/ROXICODONE) 5 MG immediate release tablet, Take 1-2 tablets (5-10 mg total) by mouth every 6 (six) hours as needed for moderate pain, severe pain or breakthrough pain., Disp: 30 tablet, Rfl: 0 .  tretinoin (RETIN-A) 0.025 % cream, Apply 1 application topically every other day., Disp: , Rfl: 2 .  TRI-SPRINTEC 0.18/0.215/0.25 MG-35 MCG tablet, TK 1 T PO D UTD, Disp: , Rfl: 3 .  TRINTELLIX 5 MG TABS, Take 5 mg by mouth daily.,  Disp: , Rfl: 3  Social History   Tobacco Use  Smoking Status Never Smoker  Smokeless Tobacco Never Used    No Known Allergies Objective:   Vitals:   09/19/18 1148  BP: 121/86  Pulse: (!) 56   There is no height or weight on file to calculate BMI. Constitutional Well developed. Well nourished.  Vascular Dorsalis pedis pulses palpable bilaterally. Posterior tibial pulses palpable bilaterally. Capillary refill normal to all digits.  No cyanosis or clubbing noted. Pedal hair growth normal.  Neurologic Normal speech. Oriented to person, place, and time. Epicritic sensation to light touch grossly present bilaterally.  Dermatologic Nails well groomed and normal in appearance. No open wounds. No skin lesions.  Orthopedic: Normal joint ROM without pain or crepitus bilaterally. No visible deformities. Pain to palpation about the medial aspect of the right ankle along the posterior tibial tendon   Radiographs: Taken and reviewed no acute fractures dislocations Assessment:   1. Exostosis of bone of ankle   2. Posterior tibial tendonitis of right leg   3. Synovitis and tenosynovitis   4. Pain and swelling of right ankle    Plan:  Patient was evaluated and treated and all questions answered.  Posterior tibial tendinitis right ankle -X-rays reviewed as above -Due to chronicity of symptoms would benefit from MRI for further eval rule out posterior tibial tendon tearing. -Discussed rest ice compression elevation use of over-the-counter anti-inflammatory medicine  No  follow-ups on file.

## 2019-07-14 ENCOUNTER — Other Ambulatory Visit: Payer: Self-pay

## 2019-07-14 DIAGNOSIS — Z20822 Contact with and (suspected) exposure to covid-19: Secondary | ICD-10-CM

## 2019-07-15 LAB — NOVEL CORONAVIRUS, NAA: SARS-CoV-2, NAA: NOT DETECTED

## 2019-07-16 ENCOUNTER — Telehealth: Payer: Self-pay | Admitting: Internal Medicine

## 2019-07-16 NOTE — Telephone Encounter (Signed)
Patient is calling to receive her negative COVID results. Patient expressed understanding. °

## 2019-09-30 ENCOUNTER — Other Ambulatory Visit: Payer: Self-pay

## 2019-09-30 DIAGNOSIS — Z20822 Contact with and (suspected) exposure to covid-19: Secondary | ICD-10-CM

## 2019-10-01 LAB — NOVEL CORONAVIRUS, NAA: SARS-CoV-2, NAA: NOT DETECTED

## 2023-01-08 ENCOUNTER — Ambulatory Visit: Payer: Self-pay | Admitting: Adult Health
